# Patient Record
Sex: Female | Born: 1965
Health system: Southern US, Community
[De-identification: ages and names within clinical notes are randomized; demographics above are authoritative.]

## PROBLEM LIST (undated history)

## (undated) DIAGNOSIS — F419 Anxiety disorder, unspecified: Secondary | ICD-10-CM

## (undated) DIAGNOSIS — B009 Herpesviral infection, unspecified: Secondary | ICD-10-CM

## (undated) DIAGNOSIS — Z8742 Personal history of other diseases of the female genital tract: Secondary | ICD-10-CM

## (undated) DIAGNOSIS — E7800 Pure hypercholesterolemia, unspecified: Secondary | ICD-10-CM

## (undated) DIAGNOSIS — G43909 Migraine, unspecified, not intractable, without status migrainosus: Secondary | ICD-10-CM

## (undated) HISTORY — DX: Anxiety disorder, unspecified: F41.9

## (undated) HISTORY — DX: Migraine, unspecified, not intractable, without status migrainosus: G43.909

## (undated) HISTORY — DX: Personal history of other diseases of the female genital tract: Z87.42

## (undated) HISTORY — DX: Pure hypercholesterolemia, unspecified: E78.00

## (undated) HISTORY — DX: Herpesviral infection, unspecified: B00.9

## (undated) HISTORY — PX: APPENDECTOMY: SHX54

---

## 2016-10-27 DIAGNOSIS — L570 Actinic keratosis: Secondary | ICD-10-CM | POA: Diagnosis not present

## 2016-10-27 DIAGNOSIS — D225 Melanocytic nevi of trunk: Secondary | ICD-10-CM | POA: Diagnosis not present

## 2016-10-27 DIAGNOSIS — D2239 Melanocytic nevi of other parts of face: Secondary | ICD-10-CM | POA: Diagnosis not present

## 2016-10-27 DIAGNOSIS — L821 Other seborrheic keratosis: Secondary | ICD-10-CM | POA: Diagnosis not present

## 2016-12-09 DIAGNOSIS — E782 Mixed hyperlipidemia: Secondary | ICD-10-CM | POA: Diagnosis not present

## 2016-12-09 DIAGNOSIS — R7301 Impaired fasting glucose: Secondary | ICD-10-CM | POA: Diagnosis not present

## 2016-12-18 DIAGNOSIS — E782 Mixed hyperlipidemia: Secondary | ICD-10-CM | POA: Diagnosis not present

## 2016-12-18 DIAGNOSIS — Z6821 Body mass index (BMI) 21.0-21.9, adult: Secondary | ICD-10-CM | POA: Diagnosis not present

## 2016-12-22 DIAGNOSIS — Z1231 Encounter for screening mammogram for malignant neoplasm of breast: Secondary | ICD-10-CM | POA: Diagnosis not present

## 2017-04-20 DIAGNOSIS — R7303 Prediabetes: Secondary | ICD-10-CM | POA: Diagnosis not present

## 2017-04-20 DIAGNOSIS — E782 Mixed hyperlipidemia: Secondary | ICD-10-CM | POA: Diagnosis not present

## 2017-04-27 DIAGNOSIS — Z1211 Encounter for screening for malignant neoplasm of colon: Secondary | ICD-10-CM | POA: Diagnosis not present

## 2017-04-27 DIAGNOSIS — R7303 Prediabetes: Secondary | ICD-10-CM | POA: Diagnosis not present

## 2017-04-27 DIAGNOSIS — E782 Mixed hyperlipidemia: Secondary | ICD-10-CM | POA: Diagnosis not present

## 2017-04-27 DIAGNOSIS — Z139 Encounter for screening, unspecified: Secondary | ICD-10-CM | POA: Diagnosis not present

## 2017-10-05 DIAGNOSIS — E782 Mixed hyperlipidemia: Secondary | ICD-10-CM | POA: Diagnosis not present

## 2017-10-05 DIAGNOSIS — R7303 Prediabetes: Secondary | ICD-10-CM | POA: Diagnosis not present

## 2017-10-09 DIAGNOSIS — J069 Acute upper respiratory infection, unspecified: Secondary | ICD-10-CM | POA: Diagnosis not present

## 2017-10-09 DIAGNOSIS — E782 Mixed hyperlipidemia: Secondary | ICD-10-CM | POA: Diagnosis not present

## 2017-10-09 DIAGNOSIS — R7303 Prediabetes: Secondary | ICD-10-CM | POA: Diagnosis not present

## 2017-10-27 DIAGNOSIS — L814 Other melanin hyperpigmentation: Secondary | ICD-10-CM | POA: Diagnosis not present

## 2017-10-27 DIAGNOSIS — D2239 Melanocytic nevi of other parts of face: Secondary | ICD-10-CM | POA: Diagnosis not present

## 2017-10-27 DIAGNOSIS — D485 Neoplasm of uncertain behavior of skin: Secondary | ICD-10-CM | POA: Diagnosis not present

## 2017-10-27 DIAGNOSIS — D225 Melanocytic nevi of trunk: Secondary | ICD-10-CM | POA: Diagnosis not present

## 2017-11-06 DIAGNOSIS — Z0000 Encounter for general adult medical examination without abnormal findings: Secondary | ICD-10-CM | POA: Diagnosis not present

## 2017-11-06 DIAGNOSIS — Z1211 Encounter for screening for malignant neoplasm of colon: Secondary | ICD-10-CM | POA: Diagnosis not present

## 2017-11-06 DIAGNOSIS — Z01419 Encounter for gynecological examination (general) (routine) without abnormal findings: Secondary | ICD-10-CM | POA: Diagnosis not present

## 2017-11-16 DIAGNOSIS — Z23 Encounter for immunization: Secondary | ICD-10-CM | POA: Diagnosis not present

## 2018-02-02 DIAGNOSIS — D485 Neoplasm of uncertain behavior of skin: Secondary | ICD-10-CM | POA: Diagnosis not present

## 2018-02-02 DIAGNOSIS — I781 Nevus, non-neoplastic: Secondary | ICD-10-CM | POA: Diagnosis not present

## 2018-04-09 DIAGNOSIS — E782 Mixed hyperlipidemia: Secondary | ICD-10-CM | POA: Diagnosis not present

## 2018-04-09 DIAGNOSIS — R7303 Prediabetes: Secondary | ICD-10-CM | POA: Diagnosis not present

## 2018-04-16 DIAGNOSIS — E782 Mixed hyperlipidemia: Secondary | ICD-10-CM | POA: Diagnosis not present

## 2018-04-16 DIAGNOSIS — Z1231 Encounter for screening mammogram for malignant neoplasm of breast: Secondary | ICD-10-CM | POA: Diagnosis not present

## 2018-04-16 DIAGNOSIS — R7303 Prediabetes: Secondary | ICD-10-CM | POA: Diagnosis not present

## 2018-04-30 DIAGNOSIS — Z1231 Encounter for screening mammogram for malignant neoplasm of breast: Secondary | ICD-10-CM | POA: Diagnosis not present

## 2018-08-05 DIAGNOSIS — E782 Mixed hyperlipidemia: Secondary | ICD-10-CM | POA: Diagnosis not present

## 2018-08-16 DIAGNOSIS — Z23 Encounter for immunization: Secondary | ICD-10-CM | POA: Diagnosis not present

## 2018-08-16 DIAGNOSIS — E782 Mixed hyperlipidemia: Secondary | ICD-10-CM | POA: Diagnosis not present

## 2018-08-16 DIAGNOSIS — R7303 Prediabetes: Secondary | ICD-10-CM | POA: Diagnosis not present

## 2018-10-28 DIAGNOSIS — L814 Other melanin hyperpigmentation: Secondary | ICD-10-CM | POA: Diagnosis not present

## 2018-10-28 DIAGNOSIS — D485 Neoplasm of uncertain behavior of skin: Secondary | ICD-10-CM | POA: Diagnosis not present

## 2018-10-28 DIAGNOSIS — D2239 Melanocytic nevi of other parts of face: Secondary | ICD-10-CM | POA: Diagnosis not present

## 2018-10-28 DIAGNOSIS — D225 Melanocytic nevi of trunk: Secondary | ICD-10-CM | POA: Diagnosis not present

## 2018-11-05 ENCOUNTER — Encounter: Payer: Self-pay | Admitting: Certified Nurse Midwife

## 2018-11-16 ENCOUNTER — Other Ambulatory Visit: Payer: Self-pay

## 2018-11-16 ENCOUNTER — Ambulatory Visit: Payer: 59 | Admitting: Certified Nurse Midwife

## 2018-11-16 ENCOUNTER — Encounter: Payer: Self-pay | Admitting: Certified Nurse Midwife

## 2018-11-16 VITALS — BP 124/80 | HR 70 | Resp 16 | Ht 64.25 in | Wt 123.0 lb

## 2018-11-16 DIAGNOSIS — R102 Pelvic and perineal pain: Secondary | ICD-10-CM | POA: Diagnosis not present

## 2018-11-16 DIAGNOSIS — Z8742 Personal history of other diseases of the female genital tract: Secondary | ICD-10-CM

## 2018-11-16 DIAGNOSIS — N841 Polyp of cervix uteri: Secondary | ICD-10-CM

## 2018-11-16 DIAGNOSIS — N898 Other specified noninflammatory disorders of vagina: Secondary | ICD-10-CM

## 2018-11-16 DIAGNOSIS — N951 Menopausal and female climacteric states: Secondary | ICD-10-CM

## 2018-11-16 NOTE — Progress Notes (Signed)
Subjective:     Patient ID: Megan Cardenas, female   DOB: 10-16-1966, 53 y.o.   MRN: [id number]  53 yo g3 p2012 white married female here to establish care for problem, with complaint of menopausal symptoms and vaginal pain.Marland Kitchen LMP 11/20/2017. Denies any vaginal bleeding. Sees  Marco Collie for aex, labs, cholesterol management. Patient experiencing pain with sexual activity, and also daily with urination or touching. Denies any vaginal odor or increase discharge or lesions or breaks in skin.Marland Kitchen Has used lubricant for dryness/sexual activity, with no relief. Still having hot flashes and night sweats, but have decreased and no issues with insomnia. Taking Lexapro for anxiety and this has helped with symptoms.  Mammogram in last year at [facility] and was normal per patient. No history of abnormal pap smear. Mammogram in last year normal, history of cysts at times in breast, no concerns. No other concerns today.    Review of Systems  Constitutional: Negative.   HENT: Negative.   Eyes: Negative.   Respiratory: Negative.   Cardiovascular: Negative.   Gastrointestinal: Negative.   Endocrine: Negative.   Genitourinary: Positive for dyspareunia and vaginal pain. Negative for dysuria, frequency, genital sores, pelvic pain, urgency, vaginal bleeding and vaginal discharge.       Pain when urine touches skin only  Musculoskeletal: Negative.   Skin: Negative.   Allergic/Immunologic: Negative.   Neurological: Negative.   Hematological: Negative.   Psychiatric/Behavioral: Negative for sleep disturbance. The patient is nervous/anxious.        Lexapro working well for anxiety related to menopause       Objective:   Physical Exam Vitals signs reviewed. Exam conducted with a chaperone present.  Constitutional:      Appearance: Normal appearance. She is normal weight.  Cardiovascular:     Rate and Rhythm: Normal rate.  Pulmonary:     Effort: Pulmonary effort is normal.  Abdominal:     Palpations:  Abdomen is soft. There is no mass.     Tenderness: There is no abdominal tenderness.  Genitourinary:    Pubic Area: No rash.      Labia:        Right: Tenderness present. No rash, lesion or injury.        Left: Tenderness present. No rash, lesion or injury.      Urethra: No prolapse, urethral pain, urethral swelling or urethral lesion.     Vagina: Tenderness and lesions present. No vaginal discharge or bleeding.     Cervix: Lesion present. No discharge or erythema.     Uterus: Normal. Not tender.      Adnexa:        Right: Mass, tenderness and fullness present.        Left: Mass, tenderness and fullness present.      Rectum: Normal.          Comments: Posterior fornix of vagina normal appearance with moisture noted, normal appearing white vaginal discharge. Cervical polyp noted in cervix, no bleeding or tenderness Lymphadenopathy:     Lower Body: No right inguinal adenopathy. No left inguinal adenopathy.  Skin:    General: Skin is warm and dry.  Neurological:     Mental Status: She is alert and oriented to person, place, and time.  Psychiatric:        Mood and Affect: Mood normal.        Behavior: Behavior normal.        Thought Content: Thought content normal.  Judgment: Judgment normal.        Assessment:     Normal pelvic exam Contraception spouse vasectomy Amenorrhea ? Perimenopausal Vulva dryness with atrophic appearance and redness Cervical polyp, history of removal in past No history per patient of abnormal pap smear Mammogram current per patient    Plan:     Discussed normal pelvic exam finding. Discussed per her history appears to be perimenopausal, but has had no labs done since amenorrhea occurrence. Discussed this can be helpful with prevention of hyperplasia if not indicating menopause. Suspect due to history, all normal menopausal changes. Agreeable to labs Lab: FSH,Prolactin, TSH Discussed finding of vulva and vaginal introital area with atrophy  and dryness noted. Shown to patient in mirror also. Discussed treatment with estrogen, but prefer treating with coconut oil daily to see if this will resolve. No vaginal atrophy noted. Discussed benefits/risk and expectations of coconut oil or estrogen vaginal treatment. Patient prefers no estrogen use at this point. Also discussed Cervical polyp finding. Patient has had one before removed several years ago. Relates no bleeding with sexual activity or other times. Discussed usually benign finding. Had pap smear at last PCP visit. Will request records for follow up and possible removal if needed.. Questions addressed at length.  Rv 2 weeks, prn

## 2018-11-17 ENCOUNTER — Telehealth: Payer: Self-pay

## 2018-11-17 LAB — TSH: TSH: 1.44 u[IU]/mL (ref 0.450–4.500)

## 2018-11-17 LAB — PROLACTIN: Prolactin: 10.8 ng/mL (ref 4.8–23.3)

## 2018-11-17 LAB — FOLLICLE STIMULATING HORMONE: FSH: 87.8 m[IU]/mL

## 2018-11-17 NOTE — Telephone Encounter (Signed)
Left message for call back.

## 2018-11-17 NOTE — Telephone Encounter (Signed)
Patient notified of results. See lab 

## 2018-11-17 NOTE — Telephone Encounter (Signed)
-----   Message from Regina Eck, CNM sent at 11/17/2018 12:35 PM EST ----- Notify patient her lab work to make sure menopausal changes shows normal TSH, Prolactin normal range. Accord indicates menopausal range which also explains her symptoms

## 2018-12-01 ENCOUNTER — Ambulatory Visit: Payer: 59 | Admitting: Certified Nurse Midwife

## 2018-12-01 ENCOUNTER — Other Ambulatory Visit: Payer: Self-pay

## 2018-12-01 ENCOUNTER — Encounter: Payer: Self-pay | Admitting: Certified Nurse Midwife

## 2018-12-01 VITALS — BP 120/74 | HR 72 | Resp 16 | Ht 64.25 in | Wt 121.0 lb

## 2018-12-01 DIAGNOSIS — R6882 Decreased libido: Secondary | ICD-10-CM | POA: Diagnosis not present

## 2018-12-01 DIAGNOSIS — N952 Postmenopausal atrophic vaginitis: Secondary | ICD-10-CM

## 2018-12-01 DIAGNOSIS — N951 Menopausal and female climacteric states: Secondary | ICD-10-CM | POA: Diagnosis not present

## 2018-12-01 NOTE — Progress Notes (Signed)
  Subjective:     Patient ID: Megan Cardenas, female   DOB: 1966/08/12, 53 y.o.   MRN: [id number]  Here for follow up of atrophic vaginitis treating with coconut oil moisture, once daily. Has been able to be sexually active twice weekly with decrease in pain. Having no issues with using coconut oil. Has been using coconut balls she formed daily. Has noted decrease Libido and feel has occurred due to previous pain. Spouse understanding and no concerns. Feels much better about dryness and treatment.     Review of Systems  Constitutional: Negative.   HENT: Negative.   Eyes: Negative.   Respiratory: Negative.   Cardiovascular: Negative.   Gastrointestinal: Negative.   Endocrine: Negative.   Genitourinary: Positive for dyspareunia and vaginal pain. Negative for genital sores, pelvic pain, urgency, vaginal bleeding and vaginal discharge.       Better and only slight pain  Musculoskeletal: Negative.   Neurological: Negative.   Hematological: Negative.   Psychiatric/Behavioral: Negative.        Objective:   Physical Exam Exam conducted with a chaperone present.  Constitutional:      Appearance: Normal appearance.  Cardiovascular:     Rate and Rhythm: Normal rate.  Pulmonary:     Effort: Pulmonary effort is normal.  Genitourinary:    General: Normal vulva.     Exam position: Lithotomy position.     Pubic Area: No rash.      Labia:        Right: No rash, tenderness or lesion.        Left: No rash, tenderness or lesion.      Vagina: No vaginal discharge, erythema, tenderness, bleeding or lesions.     Cervix: No discharge or cervical bleeding.     Uterus: Normal.      Adnexa: Right adnexa normal and left adnexa normal.       Right: No tenderness or fullness.         Left: No tenderness or fullness.         Comments: Vagina has slight pale color, moisture noted in posterior fornix, no complaint of pain with exam today Lymphadenopathy:     Lower Body: No right inguinal adenopathy.  No left inguinal adenopathy.  Skin:    General: Skin is warm and dry.  Neurological:     Mental Status: She is alert and oriented to person, place, and time.  Psychiatric:        Mood and Affect: Mood normal.        Behavior: Behavior normal.        Thought Content: Thought content normal.        Judgment: Judgment normal.        Assessment:     Menopausal atrophic vaginitis responding well to coconut oil moisture. Pain with intercourse has decreased. Decrease libido    Plan:     Discussed finding with patient and shown areas with mirror of improvement. Recommend using coconut oil in am and her insertion ball of coconut oil at hs. Call if vaginal bleeding or change in comfort level. Discussed decrease libido with discomfort not unusual. Discussed Awakening counseling for couples and given printed handout, if she feels this would help.  Rv prn

## 2019-01-03 DIAGNOSIS — E782 Mixed hyperlipidemia: Secondary | ICD-10-CM | POA: Diagnosis not present

## 2019-01-03 DIAGNOSIS — R7303 Prediabetes: Secondary | ICD-10-CM | POA: Diagnosis not present

## 2019-01-10 DIAGNOSIS — Z1211 Encounter for screening for malignant neoplasm of colon: Secondary | ICD-10-CM | POA: Diagnosis not present

## 2019-01-10 DIAGNOSIS — E782 Mixed hyperlipidemia: Secondary | ICD-10-CM | POA: Diagnosis not present

## 2019-01-10 DIAGNOSIS — R7303 Prediabetes: Secondary | ICD-10-CM | POA: Diagnosis not present

## 2019-02-01 DIAGNOSIS — Z1211 Encounter for screening for malignant neoplasm of colon: Secondary | ICD-10-CM | POA: Diagnosis not present

## 2019-02-01 DIAGNOSIS — Z1212 Encounter for screening for malignant neoplasm of rectum: Secondary | ICD-10-CM | POA: Diagnosis not present

## 2019-02-24 DIAGNOSIS — L910 Hypertrophic scar: Secondary | ICD-10-CM | POA: Diagnosis not present

## 2019-02-24 DIAGNOSIS — D485 Neoplasm of uncertain behavior of skin: Secondary | ICD-10-CM | POA: Diagnosis not present

## 2019-08-21 HISTORY — PX: BREAST BIOPSY: SHX20

## 2019-09-01 ENCOUNTER — Other Ambulatory Visit: Payer: Self-pay | Admitting: Family Medicine

## 2019-09-01 ENCOUNTER — Other Ambulatory Visit: Payer: Self-pay | Admitting: Diagnostic Radiology

## 2019-09-01 DIAGNOSIS — R922 Inconclusive mammogram: Secondary | ICD-10-CM

## 2019-09-01 DIAGNOSIS — N6489 Other specified disorders of breast: Secondary | ICD-10-CM

## 2019-09-02 ENCOUNTER — Other Ambulatory Visit: Payer: Self-pay | Admitting: Interventional Radiology

## 2019-09-09 ENCOUNTER — Ambulatory Visit
Admission: RE | Admit: 2019-09-09 | Discharge: 2019-09-09 | Disposition: A | Discharge status: Home / Self Care | Payer: 59 | Source: Ambulatory Visit | Attending: Family Medicine | Admitting: Family Medicine

## 2019-09-09 ENCOUNTER — Other Ambulatory Visit: Payer: Self-pay

## 2019-09-09 DIAGNOSIS — R922 Inconclusive mammogram: Secondary | ICD-10-CM

## 2019-09-09 DIAGNOSIS — N6489 Other specified disorders of breast: Secondary | ICD-10-CM

## 2019-10-17 HISTORY — PX: BREAST EXCISIONAL BIOPSY: SUR124

## 2019-12-13 ENCOUNTER — Telehealth: Payer: Self-pay | Admitting: Certified Nurse Midwife

## 2019-12-13 NOTE — Telephone Encounter (Signed)
Patient told last year she was perimenopausal and should not have any bleeding. She is now experiencing bleeding and would like to be seen.

## 2019-12-13 NOTE — Telephone Encounter (Signed)
Spoke to pt. Pt states started having some light bleeding a couple months ago x 2 days and now having some light bleeding for couple of hours with cramps last night and now stopped. Pt wants to be seen. Pt going out of town starting tomorrow for the weekend, so denies earlier appt. Pt scheduled on 12/19/2019 at 11 am. Pt agreeable to date and time of appt.   Routing to Debbi, CNM for review and will close encounter.

## 2019-12-19 ENCOUNTER — Other Ambulatory Visit (HOSPITAL_COMMUNITY)
Admission: RE | Admit: 2019-12-19 | Discharge: 2019-12-19 | Disposition: A | Discharge status: Home / Self Care | Payer: 59 | Source: Ambulatory Visit | Attending: Certified Nurse Midwife | Admitting: Certified Nurse Midwife

## 2019-12-19 ENCOUNTER — Telehealth: Payer: Self-pay

## 2019-12-19 ENCOUNTER — Ambulatory Visit: Payer: 59 | Admitting: Certified Nurse Midwife

## 2019-12-19 ENCOUNTER — Other Ambulatory Visit: Payer: Self-pay

## 2019-12-19 ENCOUNTER — Encounter: Payer: Self-pay | Admitting: Certified Nurse Midwife

## 2019-12-19 VITALS — BP 126/72 | HR 66 | Temp 98.2°F | Resp 14 | Ht 64.0 in | Wt 123.4 lb

## 2019-12-19 DIAGNOSIS — Z01419 Encounter for gynecological examination (general) (routine) without abnormal findings: Secondary | ICD-10-CM | POA: Diagnosis not present

## 2019-12-19 DIAGNOSIS — N840 Polyp of corpus uteri: Secondary | ICD-10-CM

## 2019-12-19 DIAGNOSIS — Z124 Encounter for screening for malignant neoplasm of cervix: Secondary | ICD-10-CM | POA: Diagnosis not present

## 2019-12-19 DIAGNOSIS — N939 Abnormal uterine and vaginal bleeding, unspecified: Secondary | ICD-10-CM

## 2019-12-19 DIAGNOSIS — N950 Postmenopausal bleeding: Secondary | ICD-10-CM

## 2019-12-19 NOTE — Progress Notes (Signed)
54 y.o. [id number] Married  Caucasian Fe here for post menopausal bleeding but did not have gyn exam, with other MD(PCP) and did not do pap smear then. Will do today. Had noted vaginal bleeding in 12/20 one occurrence, dark but required pad with 2 day duration.Marland Kitchen Has noted vaginal bleeding again 12/14/2019 with smaller amount and was not bright red, more light spotting. No history fibroids, but history polyp in past which was removed. Patient continues to have hot flashes off and on, but no concerns. Continues with coconut oil for atrophic vaginitis with good response. Recent trip to [facility] trip! No other health issues today.  Patient's last menstrual period was 12/13/2019.          Sexually active: Yes.    The current method of family planning is post menopausal status.    Smoker:  Former smoker   Review of Systems  Constitutional: Negative.   HENT: Negative.   Eyes: Negative.   Respiratory: Negative.   Cardiovascular: Negative.   Gastrointestinal: Negative.   Genitourinary:       Irregular bleeding   Musculoskeletal: Negative.   Skin: Negative.   Neurological: Negative.   Endo/Heme/Allergies: Negative.   Psychiatric/Behavioral: Negative.     Health Maintenance: Pap:  Unsure  History of Abnormal Pap: yes MMG:  09-09-2019 breast biopsy- fibrocystic changes, with mass removal in 09/2019 benign Self Breast exams: yes Colonoscopy:  none BMD:   none TDaP: UTD Shingles: no Pneumonia: no Hep C and HIV: donated blood in the past  Labs: discuss with provider   reports that she has quit smoking. She has never used smokeless tobacco. She reports current alcohol use of about 2.0 standard drinks of alcohol per week. She reports that she does not use drugs.  Past Medical History:  Diagnosis Date  . Anxiety   . Elevated cholesterol   . H/O menorrhagia   . Migraines     Past Surgical History:  Procedure Laterality Date  . APPENDECTOMY      Current Outpatient Medications   Medication Sig Dispense Refill  . aspirin EC 81 MG tablet Take by mouth daily.     Marland Kitchen escitalopram (LEXAPRO) 20 MG tablet Take 20 mg by mouth daily.    Marland Kitchen ezetimibe (ZETIA) 10 MG tablet     . Multiple Vitamins-Minerals (MULTIVITAMIN PO) Take by mouth.    . rosuvastatin (CRESTOR) 5 MG tablet 3 times a week     No current facility-administered medications for this visit.    Family History  Problem Relation Age of Onset  . Stroke Brother        in his 59's  . Friedreich's ataxia Brother   . Heart attack Maternal Grandfather   . Heart disease Paternal Grandmother     ROS:  Pertinent items are noted in HPI.  Otherwise, a comprehensive ROS was negative.  Exam:   BP 126/72 (BP Location: Right Arm, Patient Position: Sitting, Cuff Size: Normal)   Pulse 66   Temp 98.2 F (36.8 C) (Skin)   Resp 14   Ht 5\' 4"  (1.626 m)   Wt 123 lb 6.4 oz (56 kg)   LMP 12/13/2019   BMI 21.18 kg/m  Height: 5\' 4"  (162.6 cm) Ht Readings from Last 3 Encounters:  12/19/19 5\' 4"  (1.626 m)  12/01/18 5' 4.25" (1.632 m)  11/16/18 5' 4.25" (1.632 m)    General appearance: alert, cooperative and appears stated age Head: Normocephalic, without obvious abnormality, atraumatic Neck: no adenopathy, supple, symmetrical, trachea midline and  thyroid normal to inspection and palpation Lungs: clear to auscultation bilaterally Breasts: normal appearance, no masses or tenderness, No nipple retraction or dimpling, No nipple discharge or bleeding, No axillary or supraclavicular adenopathy Heart: regular rate and rhythm Abdomen: soft, non-tender; no masses,  no organomegaly Extremities: extremities normal, atraumatic, no cyanosis or edema Skin: Skin color, texture, turgor normal. No rashes or lesions Lymph nodes: Cervical, supraclavicular, and axillary nodes normal. No abnormal inguinal nodes palpated Neurologic: Grossly normal   Pelvic: External genitalia:  no lesions, atrophy noted, but improved               Urethra:  normal appearing urethra with no masses, tenderness or lesions              Bartholin's and Skene's: normal                 Vagina: atrophic appearing vagina with normal color and scant discharge, no lesions, no blood noted in vaginal vault              Cervix:normal appearance with moderate size polyp noted in cervical os. Bleeding noted with obtaining pap smear.              Pap taken: Yes.   Bimanual Exam:  Uterus:  normal size, contour, position, consistency, mobility, non-tender and anteverted              Adnexa: normal adnexa and no mass, fullness, tenderness               Rectovaginal: Confirms               Anus:  normal sphincter tone, small external hemorrhoids noted, not thrombosed  Chaperone present: yes   A:  Well Woman with normal exam  History of post menopausal bleeding  Cervical polyp noted in cervical os, probable cause of bleeding  Post menopausal atrophic vaginitis, using coconut oil with good response  P:   Reviewed health and wellness pertinent to exam  Discussed finding of cervical polyp, could be only cause of bleeding, but could also have extension into uterine cavity, or polyp or fibroid concern. Discussed need for PUS for evaluation. Patient agreeable. Patient will be called with information and scheduled. Discussed with Dr. Quincy Simmonds and agrees with plan. Patient will call if further bleeding occurs.  Continue with coconut oil as discussed.  Pap smear: yes   counseled on breast self exam, mammography screening, menopause, adequate intake of calcium and vitamin D, diet and exercise  return annually or prn  An After Visit Summary was printed and given to the patient.

## 2019-12-19 NOTE — Telephone Encounter (Signed)
-----   Message from Regina Eck, CNM sent at 12/19/2019  5:10 PM EST ----- Patient needs PUS for PMB. Has cervical polyp. Pap pendingPlease schedule with Dr. Quincy Simmonds

## 2019-12-19 NOTE — Telephone Encounter (Signed)
Left detailed message for pt to call back to speak with Colletta Maryland, RN to schedule PUS for PMB with Dr Quincy Simmonds.

## 2019-12-19 NOTE — Patient Instructions (Addendum)
EXERCISE AND DIET:  We recommended that you start or continue a regular exercise program for good health. Regular exercise means any activity that makes your heart beat faster and makes you sweat.  We recommend exercising at least 30 minutes per day at least 3 days a week, preferably 4 or 5.  We also recommend a diet low in fat and sugar.  Inactivity, poor dietary choices and obesity can cause diabetes, heart attack, stroke, and kidney damage, among others.    ALCOHOL AND SMOKING:  Women should limit their alcohol intake to no more than 7 drinks/beers/glasses of wine (combined, not each!) per week. Moderation of alcohol intake to this level decreases your risk of breast cancer and liver damage. And of course, no recreational drugs are part of a healthy lifestyle.  And absolutely no smoking or even second hand smoke. Most people know smoking can cause heart and lung diseases, but did you know it also contributes to weakening of your bones? Aging of your skin?  Yellowing of your teeth and nails?  CALCIUM AND VITAMIN D:  Adequate intake of calcium and Vitamin D are recommended.  The recommendations for exact amounts of these supplements seem to change often, but generally speaking 600 mg of calcium (either carbonate or citrate) and 800 units of Vitamin D per day seems prudent. Certain women may benefit from higher intake of Vitamin D.  If you are among these women, your doctor will have told you during your visit.    PAP SMEARS:  Pap smears, to check for cervical cancer or precancers,  have traditionally been done yearly, although recent scientific advances have shown that most women can have pap smears less often.  However, every woman still should have a physical exam from her gynecologist every year. It will include a breast check, inspection of the vulva and vagina to check for abnormal growths or skin changes, a visual exam of the cervix, and then an exam to evaluate the size and shape of the uterus and  ovaries.  And after 56 years of age, a rectal exam is indicated to check for rectal cancers. We will also provide age appropriate advice regarding health maintenance, like when you should have certain vaccines, screening for sexually transmitted diseases, bone density testing, colonoscopy, mammograms, etc.   MAMMOGRAMS:  All women over 40 years old should have a yearly mammogram. Many facilities now offer a "3D" mammogram, which may cost around $50 extra out of pocket. If possible,  we recommend you accept the option to have the 3D mammogram performed.  It both reduces the number of women who will be called back for extra views which then turn out to be normal, and it is better than the routine mammogram at detecting truly abnormal areas.    COLONOSCOPY:  Colonoscopy to screen for colon cancer is recommended for all women at age 50.  We know, you hate the idea of the prep.  We agree, BUT, having colon cancer and not knowing it is worse!!  Colon cancer so often starts as a polyp that can be seen and removed at colonscopy, which can quite literally save your life!  And if your first colonoscopy is normal and you have no family history of colon cancer, most women don't have to have it again for 10 years.  Once every ten years, you can do something that may end up saving your life, right?  We will be happy to help you get it scheduled when you are ready.    Be sure to check your insurance coverage so you understand how much it will cost.  It may be covered as a preventative service at no cost, but you should check your particular policy.        Postmenopausal Bleeding  Postmenopausal bleeding is any bleeding that occurs after menopause. Menopause is when a woman's period stops. Any type of bleeding after menopause should be checked by your doctor. Treatment will depend on the cause. Follow these instructions at home:  Pay attention to any changes in your symptoms.  Avoid using tampons and douches as told  by your doctor.  Change your pads regularly.  Get regular pelvic exams and Pap tests.  Take iron pills as told by your doctor.  Take over-the-counter and prescription medicines only as told by your doctor.  Keep all follow-up visits as told by your doctor. This is important. Contact a doctor if:  Your bleeding lasts for more than 1 week.  You have pain in your belly (abdomen).  You have bleeding during or after sex.  You have bleeding that happens more often than every 3 weeks. Get help right away if:  You have fever, chills, headache, dizziness, muscle aches, or bleeding.  You have very bad pain with bleeding.  You have clumps of blood (blood clots) coming from your vagina.  You have a lot of bleeding, and: ? You use more than 1 pad an hour. ? This kind of bleeding has never happened before.  You feel like you are going to pass out (faint). Summary  Any type of bleeding after menopause should be checked by your doctor.  Pay attention to any changes in your symptoms.  Keep all follow-up visits as told by your doctor. This information is not intended to replace advice given to you by your health care provider. Make sure you discuss any questions you have with your health care provider. Document Revised: 12/23/2018 Document Reviewed: 11/11/2016 Elsevier Patient Education  2020 [employer].

## 2019-12-20 NOTE — Telephone Encounter (Signed)
Spoke to pt. Pt scheduled for PUS for PMB and cervical polyp on 12/29/2019 at 1030 am then OV with Dr Quincy Simmonds. Pt agreeable and aware of call for benefits from precert.  Routing to Dr Quincy Simmonds for review. Johny Shock, CNM for FYI CcAngie Fava for precert. Orders placed.   Encounter closed.

## 2019-12-21 LAB — CYTOLOGY - PAP
Comment: NEGATIVE
Diagnosis: NEGATIVE
High risk HPV: NEGATIVE

## 2019-12-28 ENCOUNTER — Other Ambulatory Visit: Payer: Self-pay

## 2019-12-29 ENCOUNTER — Ambulatory Visit (INDEPENDENT_AMBULATORY_CARE_PROVIDER_SITE_OTHER): Payer: 59

## 2019-12-29 ENCOUNTER — Other Ambulatory Visit (HOSPITAL_COMMUNITY)
Admission: RE | Admit: 2019-12-29 | Discharge: 2019-12-29 | Disposition: A | Discharge status: Home / Self Care | Payer: 59 | Source: Ambulatory Visit | Attending: Obstetrics and Gynecology | Admitting: Obstetrics and Gynecology

## 2019-12-29 ENCOUNTER — Encounter: Payer: Self-pay | Admitting: Obstetrics and Gynecology

## 2019-12-29 ENCOUNTER — Ambulatory Visit (INDEPENDENT_AMBULATORY_CARE_PROVIDER_SITE_OTHER): Payer: 59 | Admitting: Obstetrics and Gynecology

## 2019-12-29 VITALS — BP 136/74 | HR 80 | Temp 97.1°F | Ht 64.0 in | Wt 123.8 lb

## 2019-12-29 DIAGNOSIS — N939 Abnormal uterine and vaginal bleeding, unspecified: Secondary | ICD-10-CM

## 2019-12-29 DIAGNOSIS — N840 Polyp of corpus uteri: Secondary | ICD-10-CM | POA: Diagnosis not present

## 2019-12-29 DIAGNOSIS — N950 Postmenopausal bleeding: Secondary | ICD-10-CM | POA: Diagnosis not present

## 2019-12-29 DIAGNOSIS — N841 Polyp of cervix uteri: Secondary | ICD-10-CM | POA: Insufficient documentation

## 2019-12-29 DIAGNOSIS — D219 Benign neoplasm of connective and other soft tissue, unspecified: Secondary | ICD-10-CM | POA: Diagnosis not present

## 2019-12-29 NOTE — Progress Notes (Signed)
GYNECOLOGY  VISIT   HPI: 54 y.o.   Married  Caucasian  female   [phone] with Patient's last menstrual period was 12/13/2019.   here for pelvic ultrasound and possible EMB.    Patient seen by Melvia Heaps for vaginal bleeding on 12/19/19.  Bleeding 12/20 once and then again 12/14/19.  She had a cervical polyp noted.  Her pap was normal and neg HR HPV.  Pelvic US was ordered to complete her evaluation.   Her LMP was 2 year ago, Feb. 2019. No HRT.  Her menses were heavy prior to when her cycles stopped.  She used Lysteda.  No prior knowledge of fibroids.   She had a small polyp removed from the cervix in the past.   GYNECOLOGIC HISTORY: Patient's last menstrual period was 12/13/2019. Contraception: PMP Menopausal hormone therapy:  none Last mammogram: 09-09-2019 breast biopsy- fibrocystic changes  Last pap smear: 12-19-19 Neg:Neg HR HPV        OB History    Gravida  3   Para      Term      Preterm      AB  1   Living  2     SAB      TAB  1   Ectopic      Multiple      Live Births                 There are no problems to display for this patient.   Past Medical History:  Diagnosis Date  . Anxiety   . Elevated cholesterol   . H/O menorrhagia   . Migraines     Past Surgical History:  Procedure Laterality Date  . APPENDECTOMY      Current Outpatient Medications  Medication Sig Dispense Refill  . aspirin EC 81 MG tablet Take by mouth daily.     Marland Kitchen escitalopram (LEXAPRO) 20 MG tablet Take 20 mg by mouth daily.    Marland Kitchen ezetimibe (ZETIA) 10 MG tablet     . Multiple Vitamins-Minerals (MULTIVITAMIN PO) Take by mouth.    . rosuvastatin (CRESTOR) 5 MG tablet 3 times a week     No current facility-administered medications for this visit.     ALLERGIES: Patient has no known allergies.  Family History  Problem Relation Age of Onset  . Stroke Brother        in his 66's  . Friedreich's ataxia Brother   . Heart attack Maternal Grandfather   . Heart  disease Paternal Grandmother     Social History   Socioeconomic History  . Marital status: Married    Spouse name: Not on file  . Number of children: Not on file  . Years of education: Not on file  . Highest education level: Not on file  Occupational History  . Not on file  Tobacco Use  . Smoking status: Former Research scientist (life sciences)  . Smokeless tobacco: Never Used  . Tobacco comment: for 1 yr as a teenager  Substance and Sexual Activity  . Alcohol use: Yes    Alcohol/week: 2.0 standard drinks    Types: 2 Standard drinks or equivalent per week  . Drug use: Never  . Sexual activity: Yes    Partners: Male    Birth control/protection: Other-see comments    Comment: husband vasectomy  Other Topics Concern  . Not on file  Social History Narrative  . Not on file   Social Determinants of Health   Financial Resource Strain:   .  Difficulty of Paying Living Expenses:   Food Insecurity:   . Worried About Charity fundraiser in the Last Year:   . Arboriculturist in the Last Year:   Transportation Needs:   . Film/video editor (Medical):   Marland Kitchen Lack of Transportation (Non-Medical):   Physical Activity:   . Days of Exercise per Week:   . Minutes of Exercise per Session:   Stress:   . Feeling of Stress :   Social Connections:   . Frequency of Communication with Friends and Family:   . Frequency of Social Gatherings with Friends and Family:   . Attends Religious Services:   . Active Member of Clubs or Organizations:   . Attends Archivist Meetings:   Marland Kitchen Marital Status:   Intimate Partner Violence:   . Fear of Current or Ex-Partner:   . Emotionally Abused:   Marland Kitchen Physically Abused:   . Sexually Abused:     Review of Systems  All other systems reviewed and are negative.   PHYSICAL EXAMINATION:    BP 136/74   Pulse 80   Temp (!) 97.1 F (36.2 C) (Temporal)   Ht 5\' 4"  (1.626 m)   Wt 123 lb 12.8 oz (56.2 kg)   LMP 12/13/2019   BMI 21.25 kg/m     General appearance:  alert, cooperative and appears stated age  Pelvic: External genitalia:  no lesions              Urethra:  normal appearing urethra with no masses, tenderness or lesions              Bartholins and Skenes: normal                 Vagina: normal appearing vagina with normal color and discharge, no lesions              Cervix: polyp prolapsing from os - 4 - 5 mm diameter.                  Bimanual Exam:  Uterus:  normal size, contour, position, consistency, mobility, non-tender              Adnexa: no mass, fullness, tenderness           Pelvic US Uterus with 3 fibroids - intramural and subserosal, largest 2 cm, one calcified.  EMS 3.23 mm.  Cervix with 9 mm mass and feeder vessel.  Ovaries normal.  No free fluid.   EMB and polyp removal. Consent for procedures.  Sterile prep with Hibiclens.  Paracervical block with 10 cc 1% lidocaine, lot number [id number], Exp 4/22.  Thin ring forceps used to remove polyp, which was sent to pathology.  Pipelle passed to 5 cm x 2 for endometrial biopsy.  Tissue to pathology.  Minimal EBL.  No complications.   Chaperone was present for exam.  ASSESSMENT  Postmenopausal bleeding.  Cervical polyp.  Fibroids.    PLAN  US findings reviewed including images.  Discussion of postmenopausal bleeding and potential etiologies - polyp, atrophy, fibroids, precancer and cancer. Fu EMB and cervical polyp pathology reports.  Post procedure instructions to patient.  Call for any future vaginal bleeding.    An After Visit Summary was printed and given to the patient.  __15____ minutes face to face time of which over 50% was spent in counseling.

## 2019-12-29 NOTE — Progress Notes (Signed)
Encounter reviewed by Dr. Demecia Northway Amundson C. Silva.  

## 2019-12-29 NOTE — Patient Instructions (Signed)

## 2020-01-02 LAB — SURGICAL PATHOLOGY

## 2020-01-11 ENCOUNTER — Encounter: Payer: Self-pay | Admitting: Certified Nurse Midwife

## 2020-06-20 IMAGING — MG MM BREAST LOCALIZATION CLIP
4 series · 4 of 12 positions shown · non-contrast
Comparison: Previous exam(s).

CLINICAL DATA: Patient presents for biopsy of right breast
architectural distortion.

EXAM:
DIAGNOSTIC RIGHT MAMMOGRAM POST STEREOTACTIC BIOPSY

[R ML synth-2D]
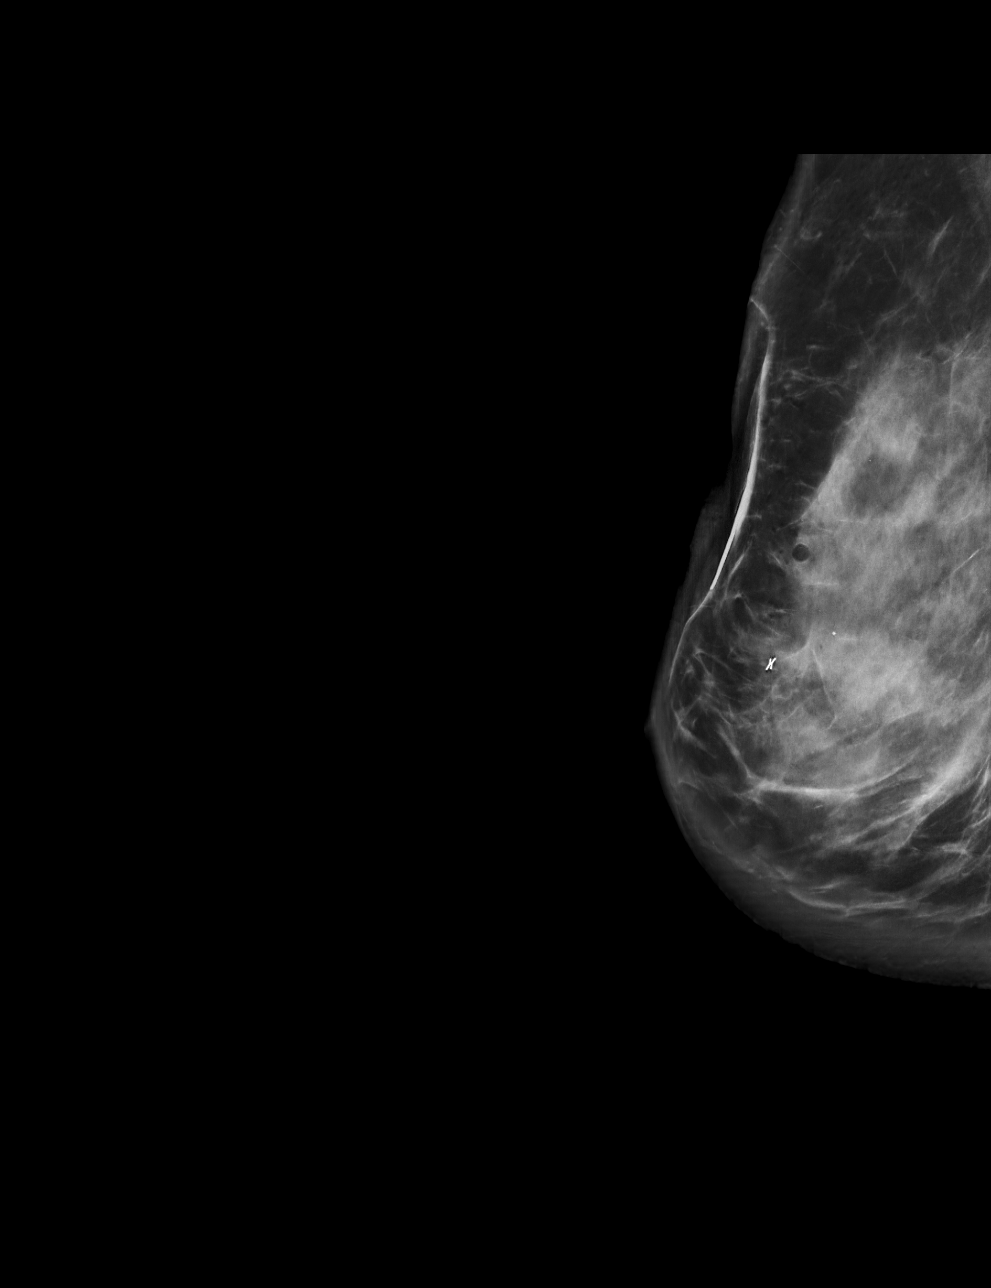

[R CC synth-2D]
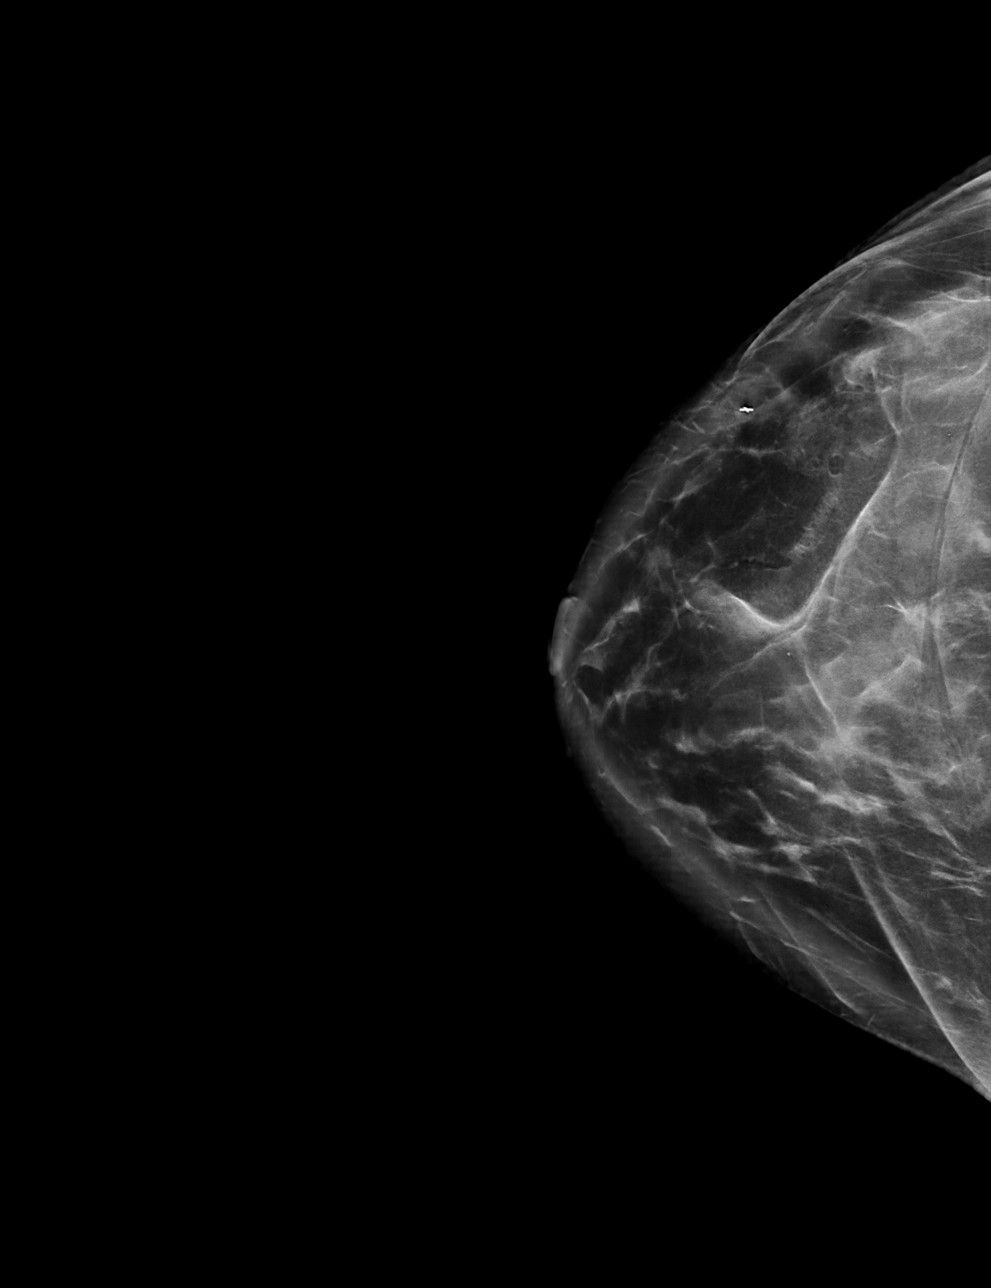

[R ML tomo · tomo slice 51/100.0]
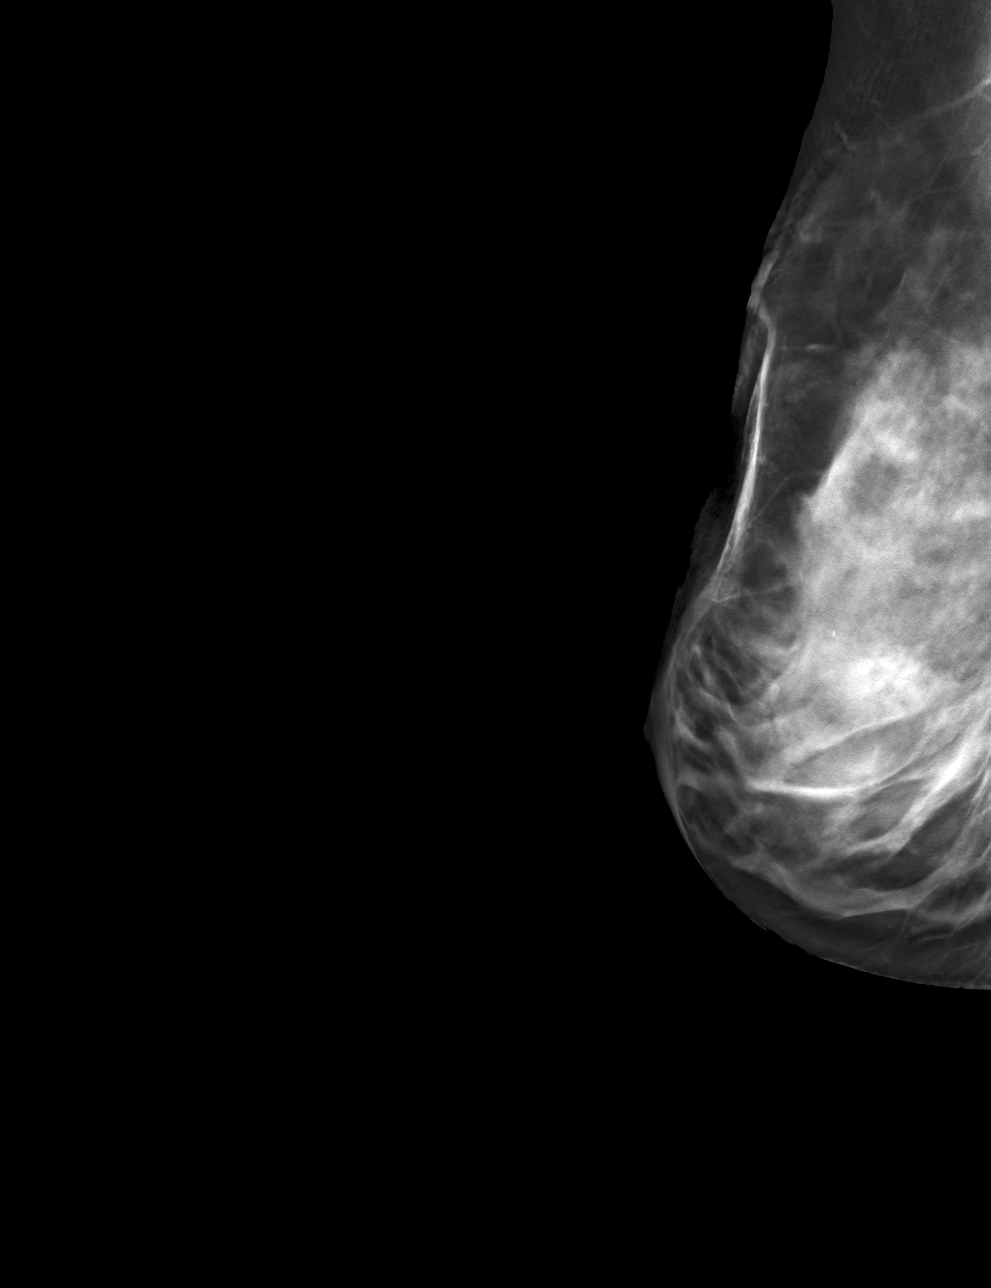

[R CC tomo · tomo slice 46/91.0]
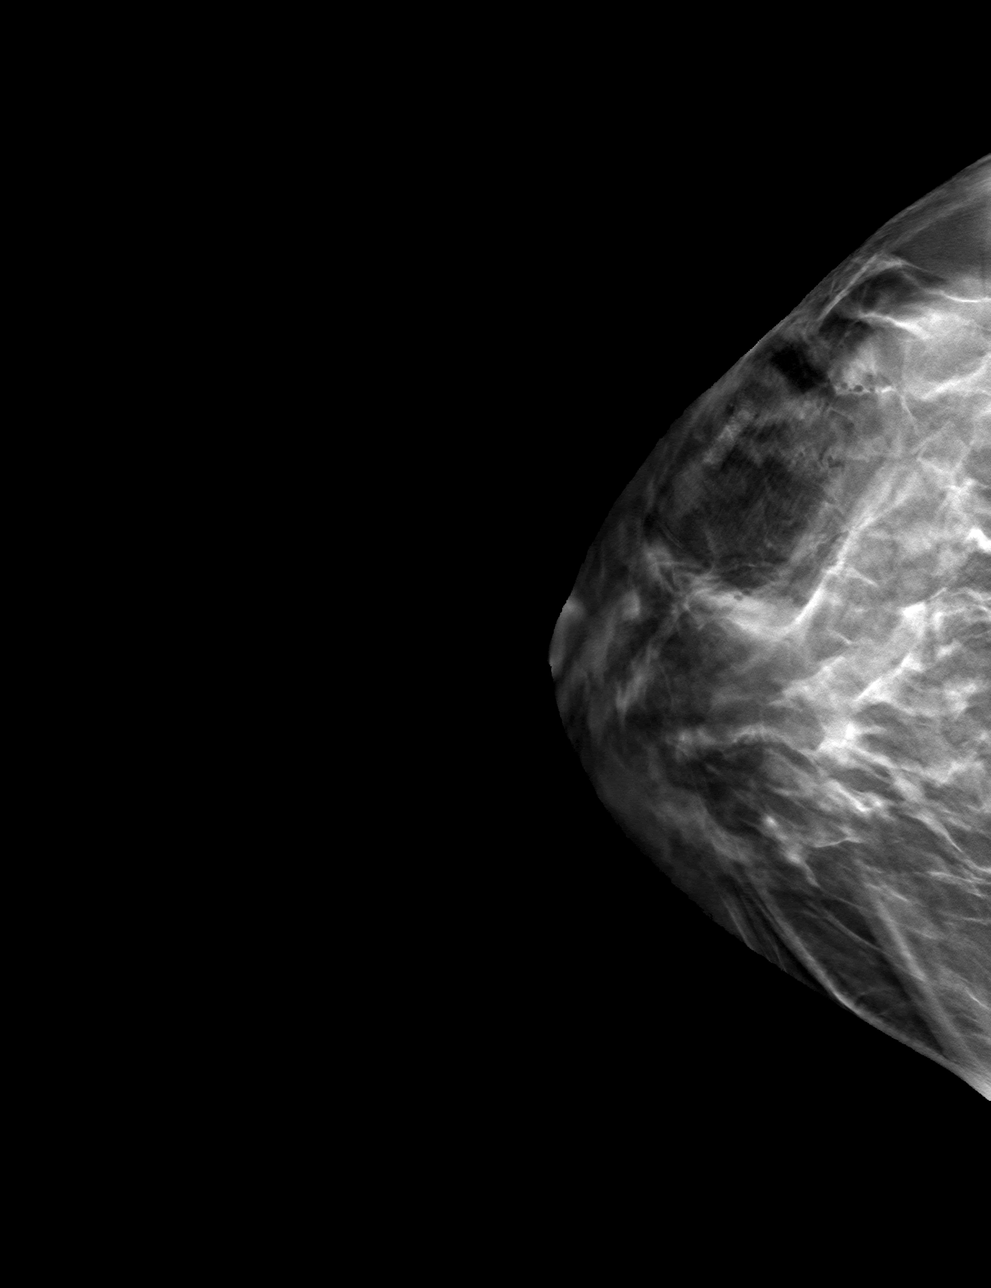

[4 of 12 positions shown; findings below may reference images not displayed]

FINDINGS: Mammographic images were obtained following stereotactic guided
biopsy of architectural distortion in the upper outer quadrant. The
biopsy marking clip is in expected position at the site of biopsy.
IMPRESSION: Appropriate positioning of the X shaped biopsy marking clip at the
site of biopsy in the right upper outer quadrant.

Final Assessment: Post Procedure Mammograms for Marker Placement

## 2022-04-29 ENCOUNTER — Other Ambulatory Visit: Payer: Self-pay | Admitting: Family Medicine

## 2022-04-29 DIAGNOSIS — Z1231 Encounter for screening mammogram for malignant neoplasm of breast: Secondary | ICD-10-CM

## 2022-06-09 ENCOUNTER — Ambulatory Visit
Admission: RE | Admit: 2022-06-09 | Discharge: 2022-06-09 | Disposition: A | Discharge status: Home / Self Care | Payer: 59 | Source: Ambulatory Visit | Attending: Family Medicine | Admitting: Family Medicine

## 2022-06-09 DIAGNOSIS — Z1231 Encounter for screening mammogram for malignant neoplasm of breast: Secondary | ICD-10-CM

## 2022-06-17 NOTE — Progress Notes (Unsigned)
56 y.o. G87P0012 Married Caucasian female here for annual exam.    Patient complaining of vaginal dryness. Has hot flashes and night sweats.   Decreased libido.   Takes Lexapro.   Husband has lung cancer and they are raising their grandchild.   PCP:  Marco Collie, MD   Patient's last menstrual period was 11/20/2017 (approximate).           Sexually active: Yes.    The current method of family planning is post menopausal status.    Exercising: No.   Utube work outs--cardio Smoker:  Former  Health Maintenance: Pap:  12-19-19 Neg:Neg HR HPV History of abnormal Pap:  no MMG:  06-09-22 Neg/Birads1 Colonoscopy:  2020 Neg cologuard w/PCP.  Will do Cologuard with PCP.  BMD:   n/a  Result  n/a TDaP:  PCP Gardasil:   no ZYS:AYTKZSW blood in past Hep C:Donated blood in past Screening Labs:  PCP   reports that she has quit smoking. She has never used smokeless tobacco. She reports current alcohol use. She reports that she does not use drugs.  Past Medical History:  Diagnosis Date   Anxiety    Elevated cholesterol    H/O menorrhagia    Migraines     Past Surgical History:  Procedure Laterality Date   APPENDECTOMY     BREAST BIOPSY Right 08/2019   fat necrosis   BREAST EXCISIONAL BIOPSY Right 10/17/2019    Current Outpatient Medications  Medication Sig Dispense Refill   aspirin EC 81 MG tablet Take by mouth daily.      escitalopram (LEXAPRO) 20 MG tablet Take 20 mg by mouth daily.     ezetimibe (ZETIA) 10 MG tablet      Multiple Vitamins-Minerals (MULTIVITAMIN PO) Take by mouth.     rosuvastatin (CRESTOR) 5 MG tablet 3 times a week     No current facility-administered medications for this visit.    Family History  Problem Relation Age of Onset   Cancer Father        prostate cancer   Congestive Heart Failure Father    Stroke Brother        in his 2's   Friedreich's ataxia Brother    Heart attack Maternal Grandfather    Heart disease Paternal Grandmother    Breast  cancer Neg Hx     Review of Systems  Constitutional:  Positive for fatigue.  Genitourinary:  Positive for vaginal pain (vaginal dryness).  All other systems reviewed and are negative.   Exam:   BP 138/80   Pulse 98   Ht '5\' 4"'$  (1.626 m)   Wt 123 lb (55.8 kg)   LMP 11/20/2017 (Approximate)   SpO2 98%   BMI 21.11 kg/m     General appearance: alert, cooperative and appears stated age Head: normocephalic, without obvious abnormality, atraumatic Neck: no adenopathy, supple, symmetrical, trachea midline and thyroid normal to inspection and palpation Lungs: clear to auscultation bilaterally Breasts: normal appearance, no masses or tenderness, No nipple retraction or dimpling, No nipple discharge or bleeding, No axillary adenopathy Heart: regular rate and rhythm Abdomen: soft, non-tender; no masses, no organomegaly Extremities: extremities normal, atraumatic, no cyanosis or edema Skin: skin color, texture, turgor normal. No rashes or lesions Lymph nodes: cervical, supraclavicular, and axillary nodes normal. Neurologic: grossly normal  Pelvic: External genitalia:  erythema of introitus consistent with atrophy.               No abnormal inguinal nodes palpated.  Urethra:  normal appearing urethra with no masses, tenderness or lesions              Bartholins and Skenes: normal                 Vagina: normal appearing vagina with normal color and discharge, no lesions              Cervix: no lesions              Pap taken: no Bimanual Exam:  Uterus:  normal size, contour, position, consistency, mobility, non-tender              Adnexa: no mass, fullness, tenderness              Rectal exam: yes.  Confirms.              Anus:  normal sphincter tone, no lesions  Chaperone was present for exam:  Estill Bamberg, CMA  Assessment:   Well woman visit with gynecologic exam. Fibroids.  Vaginal atrophy.  Decreased libido.  Likely multifactorial. Fatigue.   Plan: Mammogram screening  discussed. Self breast awareness reviewed. Pap and HR HPV as above. Guidelines for Calcium, Vitamin D, regular exercise program including cardiovascular and weight bearing exercise. Estrace cream 1/2 gram pv at hs for 2 weeks and then 2 - 3 times weekly.  I discussed potential effect on breast cancer.  She will see her PCP about changing to Wellbutrin.   CBC, TSH, vit D, and vit B12.  Follow up annually and prn.   After visit summary provided.

## 2022-06-19 ENCOUNTER — Encounter: Payer: Self-pay | Admitting: Obstetrics and Gynecology

## 2022-06-19 ENCOUNTER — Ambulatory Visit (INDEPENDENT_AMBULATORY_CARE_PROVIDER_SITE_OTHER): Payer: 59 | Admitting: Obstetrics and Gynecology

## 2022-06-19 VITALS — BP 138/80 | HR 98 | Ht 64.0 in | Wt 123.0 lb

## 2022-06-19 DIAGNOSIS — Z01419 Encounter for gynecological examination (general) (routine) without abnormal findings: Secondary | ICD-10-CM | POA: Diagnosis not present

## 2022-06-19 DIAGNOSIS — R5383 Other fatigue: Secondary | ICD-10-CM

## 2022-06-19 MED ORDER — ESTRADIOL 0.1 MG/GM VA CREA: TOPICAL_CREAM | VAGINAL | 3 refills | Status: DC

## 2022-06-19 NOTE — Patient Instructions (Signed)

## 2022-06-20 LAB — VITAMIN B12: Vitamin B-12: 298 pg/mL (ref 200–1100)

## 2022-06-20 LAB — CBC
HCT: 37.5 % (ref 35.0–45.0)
Hemoglobin: 12.8 g/dL (ref 11.7–15.5)
MCH: 30 pg (ref 27.0–33.0)
MCHC: 34.1 g/dL (ref 32.0–36.0)
MCV: 87.8 fL (ref 80.0–100.0)
MPV: 12.1 fL (ref 7.5–12.5)
Platelets: 252 10*3/uL (ref 140–400)
RBC: 4.27 10*6/uL (ref 3.80–5.10)
RDW: 11.8 % (ref 11.0–15.0)
WBC: 7.9 10*3/uL (ref 3.8–10.8)

## 2022-06-20 LAB — VITAMIN D 25 HYDROXY (VIT D DEFICIENCY, FRACTURES): Vit D, 25-Hydroxy: 29 ng/mL — ABNORMAL LOW (ref 30–100)

## 2022-06-20 LAB — TSH: TSH: 1.94 mIU/L (ref 0.40–4.50)

## 2022-07-06 LAB — COLOGUARD: COLOGUARD: NEGATIVE

## 2022-07-06 LAB — EXTERNAL GENERIC LAB PROCEDURE: COLOGUARD: NEGATIVE

## 2023-11-04 NOTE — Progress Notes (Signed)
58 y.o. G14P0012 Married Caucasian female here for annual exam.  Husband just passed in October. Has family support.  Taking care of grandson.  Working part-time and working out.  Taking Lexapro 20 mg daily through her PCP.  States she is doing ok.  Denies vaginal bleeding or spotting.   PCP: Abner Greenspan, MD   Patient's last menstrual period was 11/20/2017 (approximate).           Sexually active: No.  The current method of family planning is vasectomy.    Menopausal hormone therapy:  n/a Exercising: Yes.     3-4x a week Smoker:  former  OB History  Gravida Para Term Preterm AB Living  3    1 2   SAB IAB Ectopic Multiple Live Births   1       # Outcome Date GA Lbr Len/2nd Weight Sex Type Anes PTL Lv  3 Gravida           2 Gravida           1 IAB              HEALTH MAINTENANCE: Last 2 paps:  12/19/19 neg: HR HPV neg History of abnormal Pap or positive HPV:  no Mammogram:   06/09/22 Breast Density Cat C, BI-RADS CAT 1 neg Colonoscopy:  2023 neg cologuard Bone Density:  n/a  Result  n/a   Immunization History  Administered Date(s) Administered   Influenza-Unspecified 06/21/2019      reports that she has quit smoking. She has never used smokeless tobacco. She reports current alcohol use. She reports that she does not use drugs.  Past Medical History:  Diagnosis Date   Anxiety    Elevated cholesterol    H/O menorrhagia    HSV-1 infection    oral HSV   Migraines     Past Surgical History:  Procedure Laterality Date   APPENDECTOMY     BREAST BIOPSY Right 08/2019   fat necrosis   BREAST EXCISIONAL BIOPSY Right 10/17/2019    Current Outpatient Medications  Medication Sig Dispense Refill   aspirin EC 81 MG tablet Take by mouth daily.      escitalopram (LEXAPRO) 20 MG tablet Take 20 mg by mouth daily.     ezetimibe (ZETIA) 10 MG tablet      Multiple Vitamins-Minerals (MULTIVITAMIN PO) Take by mouth.     rosuvastatin (CRESTOR) 5 MG tablet 3 times a week      valACYclovir (VALTREX) 1000 MG tablet Take by mouth.     estradiol (ESTRACE) 0.1 MG/GM vaginal cream Use 1/2 g vaginally two or three times per week as needed to maintain symptom relief. 42.5 g 0   No current facility-administered medications for this visit.    ALLERGIES: Patient has no known allergies.  Family History  Problem Relation Age of Onset   Cancer Father        prostate cancer   Congestive Heart Failure Father    Stroke Brother        in his 59's   Friedreich's ataxia Brother    Heart attack Maternal Grandfather    Heart disease Paternal Grandmother    Breast cancer Neg Hx     Review of Systems  All other systems reviewed and are negative.   PHYSICAL EXAM:  BP 126/80 (BP Location: Left Arm, Patient Position: Sitting, Cuff Size: Small)   Pulse 82   Ht 5' 5.5" (1.664 m)   Wt 121 lb (54.9 kg)  LMP 11/20/2017 (Approximate)   SpO2 100%   BMI 19.83 kg/m     General appearance: alert, cooperative and appears stated age Head: normocephalic, without obvious abnormality, atraumatic Neck: no adenopathy, supple, symmetrical, trachea midline and thyroid normal to inspection and palpation Lungs: clear to auscultation bilaterally Breasts: normal appearance, no masses or tenderness, No nipple retraction or dimpling, No nipple discharge or bleeding, No axillary adenopathy Heart: regular rate and rhythm Abdomen: soft, non-tender; no masses, no organomegaly Extremities: extremities normal, atraumatic, no cyanosis or edema Skin: skin color, texture, turgor normal. No rashes or lesions Lymph nodes: cervical, supraclavicular, and axillary nodes normal. Neurologic: grossly normal  Pelvic: External genitalia:  no lesions              No abnormal inguinal nodes palpated.              Urethra:  normal appearing urethra with no masses, tenderness or lesions              Bartholins and Skenes: normal                 Vagina: normal appearing vagina with normal color and discharge,  no lesions              Cervix: no lesions              Pap taken: No. Bimanual Exam:  Uterus:  normal size, contour, position, consistency, mobility, non-tender              Adnexa: no mass, fullness, tenderness              Rectal exam: Yes.  .  Confirms.              Anus:  normal sphincter tone, no lesions  Chaperone was present for exam:  Warren Lacy, CMA  ASSESSMENT: Well woman visit with gynecologic exam Fibroids.  Vaginal atrophy. PHQ9:  2  Bereavement.   PLAN: Mammogram screening discussed.  She will do at [facility]. Self breast awareness reviewed. Pap and HRV collected:  No.  Due in 2026. Guidelines for Calcium, Vitamin D, regular exercise program including cardiovascular and weight bearing exercise. Medication refills:  Estrace vaginal cream.  I discussed potential effect on breast cancer. Support given for the loss of her husband.  We discussed potential grief counseling.  Follow up:  yearly and prn.

## 2023-11-18 ENCOUNTER — Encounter: Payer: Self-pay | Admitting: Obstetrics and Gynecology

## 2023-11-18 ENCOUNTER — Ambulatory Visit (INDEPENDENT_AMBULATORY_CARE_PROVIDER_SITE_OTHER): Payer: BC Managed Care – PPO | Admitting: Obstetrics and Gynecology

## 2023-11-18 VITALS — BP 126/80 | HR 82 | Ht 65.5 in | Wt 121.0 lb

## 2023-11-18 DIAGNOSIS — Z1331 Encounter for screening for depression: Secondary | ICD-10-CM | POA: Diagnosis not present

## 2023-11-18 DIAGNOSIS — Z01419 Encounter for gynecological examination (general) (routine) without abnormal findings: Secondary | ICD-10-CM

## 2023-11-18 DIAGNOSIS — Z1231 Encounter for screening mammogram for malignant neoplasm of breast: Secondary | ICD-10-CM

## 2023-11-18 MED ORDER — ESTRADIOL 0.1 MG/GM VA CREA
TOPICAL_CREAM | VAGINAL | 0 refills | Status: AC
Start: 2023-11-18 — End: ?

## 2023-11-18 NOTE — Patient Instructions (Signed)

## 2023-11-23 DIAGNOSIS — D225 Melanocytic nevi of trunk: Secondary | ICD-10-CM | POA: Diagnosis not present

## 2023-11-23 DIAGNOSIS — L814 Other melanin hyperpigmentation: Secondary | ICD-10-CM | POA: Diagnosis not present

## 2023-11-23 DIAGNOSIS — L821 Other seborrheic keratosis: Secondary | ICD-10-CM | POA: Diagnosis not present

## 2023-11-23 DIAGNOSIS — D2239 Melanocytic nevi of other parts of face: Secondary | ICD-10-CM | POA: Diagnosis not present

## 2023-11-26 ENCOUNTER — Ambulatory Visit (HOSPITAL_BASED_OUTPATIENT_CLINIC_OR_DEPARTMENT_OTHER)
Admission: RE | Admit: 2023-11-26 | Discharge: 2023-11-26 | Disposition: A | Discharge status: Home / Self Care | Payer: BC Managed Care – PPO | Source: Ambulatory Visit | Attending: Obstetrics and Gynecology | Admitting: Obstetrics and Gynecology

## 2023-11-26 ENCOUNTER — Encounter (HOSPITAL_BASED_OUTPATIENT_CLINIC_OR_DEPARTMENT_OTHER): Payer: Self-pay | Admitting: Radiology

## 2023-11-26 DIAGNOSIS — Z1231 Encounter for screening mammogram for malignant neoplasm of breast: Secondary | ICD-10-CM

## 2023-11-30 ENCOUNTER — Encounter: Payer: Self-pay | Admitting: Obstetrics and Gynecology

## 2023-12-17 DIAGNOSIS — F411 Generalized anxiety disorder: Secondary | ICD-10-CM | POA: Diagnosis not present

## 2023-12-21 DIAGNOSIS — E782 Mixed hyperlipidemia: Secondary | ICD-10-CM | POA: Diagnosis not present

## 2023-12-24 DIAGNOSIS — F411 Generalized anxiety disorder: Secondary | ICD-10-CM | POA: Diagnosis not present

## 2023-12-28 DIAGNOSIS — R7303 Prediabetes: Secondary | ICD-10-CM | POA: Diagnosis not present

## 2023-12-28 DIAGNOSIS — Z6820 Body mass index (BMI) 20.0-20.9, adult: Secondary | ICD-10-CM | POA: Diagnosis not present

## 2023-12-28 DIAGNOSIS — R252 Cramp and spasm: Secondary | ICD-10-CM | POA: Diagnosis not present

## 2023-12-28 DIAGNOSIS — Z1331 Encounter for screening for depression: Secondary | ICD-10-CM | POA: Diagnosis not present

## 2023-12-28 DIAGNOSIS — E782 Mixed hyperlipidemia: Secondary | ICD-10-CM | POA: Diagnosis not present

## 2023-12-28 DIAGNOSIS — F325 Major depressive disorder, single episode, in full remission: Secondary | ICD-10-CM | POA: Diagnosis not present

## 2023-12-31 DIAGNOSIS — F411 Generalized anxiety disorder: Secondary | ICD-10-CM | POA: Diagnosis not present

## 2024-01-27 DIAGNOSIS — F411 Generalized anxiety disorder: Secondary | ICD-10-CM | POA: Diagnosis not present

## 2024-04-15 ENCOUNTER — Encounter (HOSPITAL_COMMUNITY): Payer: Self-pay | Admitting: Interventional Radiology

## 2024-06-28 DIAGNOSIS — R7303 Prediabetes: Secondary | ICD-10-CM | POA: Diagnosis not present

## 2024-06-28 DIAGNOSIS — E782 Mixed hyperlipidemia: Secondary | ICD-10-CM | POA: Diagnosis not present

## 2024-07-05 DIAGNOSIS — E782 Mixed hyperlipidemia: Secondary | ICD-10-CM | POA: Diagnosis not present

## 2024-07-05 DIAGNOSIS — F325 Major depressive disorder, single episode, in full remission: Secondary | ICD-10-CM | POA: Diagnosis not present

## 2024-07-05 DIAGNOSIS — Z23 Encounter for immunization: Secondary | ICD-10-CM | POA: Diagnosis not present

## 2024-07-05 DIAGNOSIS — Z6820 Body mass index (BMI) 20.0-20.9, adult: Secondary | ICD-10-CM | POA: Diagnosis not present

## 2024-11-22 ENCOUNTER — Ambulatory Visit: Payer: BC Managed Care – PPO | Admitting: Obstetrics and Gynecology

## 2025-01-18 ENCOUNTER — Ambulatory Visit: Admitting: Obstetrics and Gynecology
